# Patient Record
Sex: Female | Born: 1955 | Race: White | Hispanic: No | Marital: Married | Smoking: Never smoker
Health system: Southern US, Community
[De-identification: ages and names within clinical notes are randomized; demographics above are authoritative.]

## PROBLEM LIST (undated history)

## (undated) DIAGNOSIS — K589 Irritable bowel syndrome without diarrhea: Secondary | ICD-10-CM

## (undated) HISTORY — DX: Irritable bowel syndrome without diarrhea: K58.9

## (undated) HISTORY — PX: VAGINAL HYSTERECTOMY: SUR661

## (undated) HISTORY — PX: APPENDECTOMY: SHX54

## (undated) HISTORY — PX: BLADDER SUSPENSION: SHX72

## (undated) HISTORY — PX: ARTHROSCOPIC REPAIR ACL: SUR80

## (undated) HISTORY — PX: FOOT SURGERY: SHX648

## (undated) HISTORY — PX: CHOLECYSTECTOMY: SHX55

---

## 1986-09-02 HISTORY — PX: AUGMENTATION MAMMAPLASTY: SUR837

## 2005-08-02 ENCOUNTER — Encounter: Admission: RE | Admit: 2005-08-02 | Discharge: 2005-08-02 | Payer: Self-pay | Admitting: Family Medicine

## 2005-08-22 ENCOUNTER — Encounter: Admission: RE | Admit: 2005-08-22 | Discharge: 2005-08-22 | Payer: Self-pay | Admitting: Family Medicine

## 2006-09-11 ENCOUNTER — Encounter: Admission: RE | Admit: 2006-09-11 | Discharge: 2006-09-11 | Payer: Self-pay | Admitting: Family Medicine

## 2007-09-15 ENCOUNTER — Encounter: Admission: RE | Admit: 2007-09-15 | Discharge: 2007-09-15 | Payer: Self-pay | Admitting: Family Medicine

## 2008-10-06 ENCOUNTER — Encounter: Admission: RE | Admit: 2008-10-06 | Discharge: 2008-10-06 | Payer: Self-pay | Admitting: Family Medicine

## 2008-12-27 ENCOUNTER — Other Ambulatory Visit: Admission: RE | Admit: 2008-12-27 | Discharge: 2008-12-27 | Payer: Self-pay | Admitting: Obstetrics & Gynecology

## 2009-11-09 ENCOUNTER — Encounter: Admission: RE | Admit: 2009-11-09 | Discharge: 2009-11-09 | Payer: Self-pay | Admitting: Obstetrics and Gynecology

## 2010-01-02 ENCOUNTER — Ambulatory Visit (HOSPITAL_COMMUNITY): Admission: RE | Admit: 2010-01-02 | Discharge: 2010-01-03 | Payer: Self-pay | Admitting: Obstetrics and Gynecology

## 2010-01-02 ENCOUNTER — Encounter: Payer: Self-pay | Admitting: Obstetrics and Gynecology

## 2010-06-05 ENCOUNTER — Encounter: Admission: RE | Admit: 2010-06-05 | Discharge: 2010-06-05 | Payer: Self-pay | Admitting: Family Medicine

## 2010-11-19 ENCOUNTER — Other Ambulatory Visit: Payer: Self-pay | Admitting: Family Medicine

## 2010-11-19 DIAGNOSIS — Z1231 Encounter for screening mammogram for malignant neoplasm of breast: Secondary | ICD-10-CM

## 2010-11-20 LAB — CBC
MCHC: 35 g/dL (ref 30.0–36.0)
MCV: 100.9 fL — ABNORMAL HIGH (ref 78.0–100.0)
MCV: 99.2 fL (ref 78.0–100.0)
Platelets: 177 10*3/uL (ref 150–400)
Platelets: 222 10*3/uL (ref 150–400)
RBC: 4.31 MIL/uL (ref 3.87–5.11)
WBC: 6 10*3/uL (ref 4.0–10.5)
WBC: 9 10*3/uL (ref 4.0–10.5)

## 2010-11-20 LAB — ABO/RH: ABO/RH(D): O POS

## 2010-11-20 LAB — COMPREHENSIVE METABOLIC PANEL
ALT: 25 U/L (ref 0–35)
AST: 25 U/L (ref 0–37)
Albumin: 4.2 g/dL (ref 3.5–5.2)
CO2: 28 mEq/L (ref 19–32)
Chloride: 105 mEq/L (ref 96–112)
Creatinine, Ser: 0.93 mg/dL (ref 0.4–1.2)
GFR calc Af Amer: >60 mL/min (ref >60)
GFR calc non Af Amer: >60 mL/min (ref >60)
Sodium: 136 mEq/L (ref 135–145)
Total Bilirubin: 0.4 mg/dL (ref 0.3–1.2)

## 2010-11-20 LAB — HEMOGLOBIN AND HEMATOCRIT, BLOOD: Hemoglobin: 13.5 g/dL (ref 12.0–15.0)

## 2010-11-20 LAB — PROTIME-INR: Prothrombin Time: 11.6 seconds (ref 11.6–15.2)

## 2010-11-20 LAB — APTT: aPTT: 26 seconds (ref 24–37)

## 2010-11-20 LAB — TYPE AND SCREEN

## 2010-11-27 ENCOUNTER — Ambulatory Visit
Admission: RE | Admit: 2010-11-27 | Discharge: 2010-11-27 | Disposition: A | Discharge status: Home / Self Care | Payer: BC Managed Care – PPO | Source: Ambulatory Visit | Attending: Family Medicine | Admitting: Family Medicine

## 2010-11-27 DIAGNOSIS — Z1231 Encounter for screening mammogram for malignant neoplasm of breast: Secondary | ICD-10-CM

## 2010-11-30 ENCOUNTER — Other Ambulatory Visit: Payer: Self-pay | Admitting: Family Medicine

## 2010-11-30 DIAGNOSIS — R928 Other abnormal and inconclusive findings on diagnostic imaging of breast: Secondary | ICD-10-CM

## 2010-12-04 ENCOUNTER — Ambulatory Visit
Admission: RE | Admit: 2010-12-04 | Discharge: 2010-12-04 | Disposition: A | Discharge status: Home / Self Care | Payer: BC Managed Care – PPO | Source: Ambulatory Visit | Attending: Family Medicine | Admitting: Family Medicine

## 2010-12-04 DIAGNOSIS — R928 Other abnormal and inconclusive findings on diagnostic imaging of breast: Secondary | ICD-10-CM

## 2011-06-28 ENCOUNTER — Ambulatory Visit (HOSPITAL_BASED_OUTPATIENT_CLINIC_OR_DEPARTMENT_OTHER)
Admission: RE | Admit: 2011-06-28 | Discharge: 2011-06-28 | Disposition: A | Discharge status: Home / Self Care | Payer: BC Managed Care – PPO | Source: Ambulatory Visit | Attending: Urology | Admitting: Urology

## 2011-06-28 DIAGNOSIS — N8189 Other female genital prolapse: Secondary | ICD-10-CM | POA: Insufficient documentation

## 2011-06-28 DIAGNOSIS — N393 Stress incontinence (female) (male): Secondary | ICD-10-CM | POA: Insufficient documentation

## 2011-06-28 DIAGNOSIS — K219 Gastro-esophageal reflux disease without esophagitis: Secondary | ICD-10-CM | POA: Insufficient documentation

## 2011-06-28 DIAGNOSIS — N8111 Cystocele, midline: Secondary | ICD-10-CM | POA: Insufficient documentation

## 2011-06-28 DIAGNOSIS — Z01812 Encounter for preprocedural laboratory examination: Secondary | ICD-10-CM | POA: Insufficient documentation

## 2011-06-28 LAB — POCT HEMOGLOBIN-HEMACUE: Hemoglobin: 14.8 g/dL (ref 12.0–15.0)

## 2011-07-02 NOTE — Op Note (Signed)
Kristina, Williamson              ACCOUNT NO.:  [id number]  MEDICAL RECORD NO.:  [id number]  LOCATION:                                 FACILITY:  PHYSICIAN:  Eric Morganti I. Patsi Sears, M.D.DATE OF BIRTH:  12-Aug-1956  DATE OF PROCEDURE:  06/28/2011 DATE OF DISCHARGE:                              OPERATIVE REPORT   PREOPERATIVE DIAGNOSES:  Recurrent pelvic floor prolapse and recurrent stress urinary incontinence.  POSTOPERATIVE DIAGNOSES:  Recurrent pelvic floor prolapse and recurrent stress urinary incontinence.  OPERATION:  [employer] light anterior vault sacrospinous fixation and Lynx pubovaginal sling.  SURGEON:  Riyanna Crutchley I. Patsi Sears, M.D.  ANESTHESIA:  General LMA.  PREPARATION:  After appropriate pre anesthesia, the patient was brought to the operating room, placed on the operating table in a dorsal supine position where general LMA anesthesia was induced.  She was then replaced in dorsal lithotomy position where the pubis was prepped with Betadine solution and draped in usual fashion.  Note, intraoperative consultation by Dr. Jonna Coup MacDiarmid.  REVIEW OF HISTORY:  This 55 year old female is para 1-1-0, status post coincidental hysterectomy, with apical sacrospinous fixation, had 4 corner Raz with SPARC sling.  The patient had difficulty voiding postop, in May 2011, but 4 months later, after she was voiding well post Rapaflo, she noticed a "drop" of her bladder coincidental with playing tennis.  Exam shows recurrence of her cystocele, and urethral hypermobility.  Urodynamic shows early sensation, low leak point pressure of 70, with and without cystocele reduction.  The patient was felt to be very physically fit and active, and she will have mesh repair of her cystocele, with sacrospinous fixation  again, and a Lynx sling over the [facility] sling.  PROCEDURE IN DETAIL:  The cystocele was evaluated, and the pelvic organ prolapse measurement appeared to be  Aa=-1.5 cm; Ba=-2.5 cm; Ap=-0.5 cm; Bp=-4 cm, TVL=-5 cm.  Blue marking pen was used to identify the midline of the bladder neck, and the midline of the urethra.  Using 70 cc of Marcaine 0.5 with epinephrine 1:200,000, hydrodistention was accomplished.  Horizontal incision was made across the vagina and subcutaneous tissue dissected with sharp and blunt dissection.  The cystocele was dissected, and reduced with a Kelly plication using 2-0 Vicryl suture.  Dissection was accomplished into the pelvic floor, where a large amount of scar was identified.  The four-corner Lynder Parents was identified at the ischial spines. I asked Dr. Sherron Monday to come into the operating room for consultation, to determine the location of the ischial spines, and determine location of the 4 corner Raz sutures.  Once identified, I was able to takedown the sutures on the right side, the left side was able to go around the sutures.  A captive device was placed, and the [employer] light mesh sleeves were placed through the sacrospinous ligament without difficulty.  Good placement medially was accomplished.  The mesh was sutured in place in both the proximal and distal portions, with tensioning of the mesh.  The mesh was smooth and flat against the bladder.  There was no folding. The sleeves were cut and removed.  Irrigation was accomplished.  There was some bleeding which was troublesome at the  right corner, and I elected to place some FloSeal in the wound.  Therefore, FloSeal was injected bilaterally, with packing for 3 minutes with resolution of bleeding.  I then closed over the mesh with tissue, and then closed the wound with two separate running 2-0 Vicryl sutures.  No vaginal tissue was excised.  The attention was then directed to the [facility] sling.  The midline of the urethra was again marked, and the bladder neck was noted.  Injection with hydrodistention was accomplished with 20 mL of the injectable solution, an incision  was made.  Subcutaneous tissue was dissected bilaterally.  The area suprapubically was noted two fingerbreadths above the pubic tubercle and lateral to the pubic tubercle, and marked with a marking pen.  Incisions were made.  The [place] needles were then placed bilaterally.  Cystoscopy was accomplished after indigo carmine given.  A blue dye was seen from both ureteral orifices as jets.  No Lynx needles were noted in the bladder.  The bladder was examined with both the 30 degree and the 70 degree lenses.  The urethra appeared normal and the bladder base appeared normal as well.  There was some irritation of bladder from previous catheterization.  The [place] mesh was then placed on the needles and brought retropubically with a large right angle clamp in the midline for tensioning.  The clamp was left behind the midline as the blue tab was cut from the midline, and the plastic removed.  The right-angle clamp was also left for smooth tensioning when the arms were then cut subcutaneously at the level of pubic tubercle.  The wound was closed with 2-0 Vicryl suture.  300 mL of saline was placed in the bladder, the catheter removed, and no leakage was noted.  The bladder was then drained with a Foley catheter, Foley catheter and packing remained for 24 hours.  Packing was placed  with the usual packing forceps and Estrace cream.  The patient was given IV Toradol, awakened, taken to recovery room in good condition.     [name] I. Patsi Sears, M.D.     SIT/MEDQ  D:  06/28/2011  T:  06/28/2011  Job:  [id number]  cc:   Marjory Lies, M.D. Fax: [phone]  [name], MD Fax: [phone]  Martina Sinner, MD Fax: [phone]  Electronically Signed by Jethro Bolus M.D. on 07/02/2011 05:25:38 PM

## 2011-11-27 ENCOUNTER — Other Ambulatory Visit: Payer: Self-pay | Admitting: Family Medicine

## 2011-11-27 DIAGNOSIS — Z1231 Encounter for screening mammogram for malignant neoplasm of breast: Secondary | ICD-10-CM

## 2011-12-10 ENCOUNTER — Ambulatory Visit
Admission: RE | Admit: 2011-12-10 | Discharge: 2011-12-10 | Disposition: A | Discharge status: Home / Self Care | Payer: BC Managed Care – PPO | Source: Ambulatory Visit | Attending: Family Medicine | Admitting: Family Medicine

## 2011-12-10 DIAGNOSIS — Z1231 Encounter for screening mammogram for malignant neoplasm of breast: Secondary | ICD-10-CM

## 2012-01-10 IMAGING — MG MM DIGITAL DIAG LTD R {BCG}
2 series · 2 of 2 positions shown · non-contrast
Comparison: 11/27/2010

CLINICAL DATA: Further evaluation of right asymmetry

DIGITAL DIAGNOSTIC RIGHT MAMMOGRAM

[R MLOID]
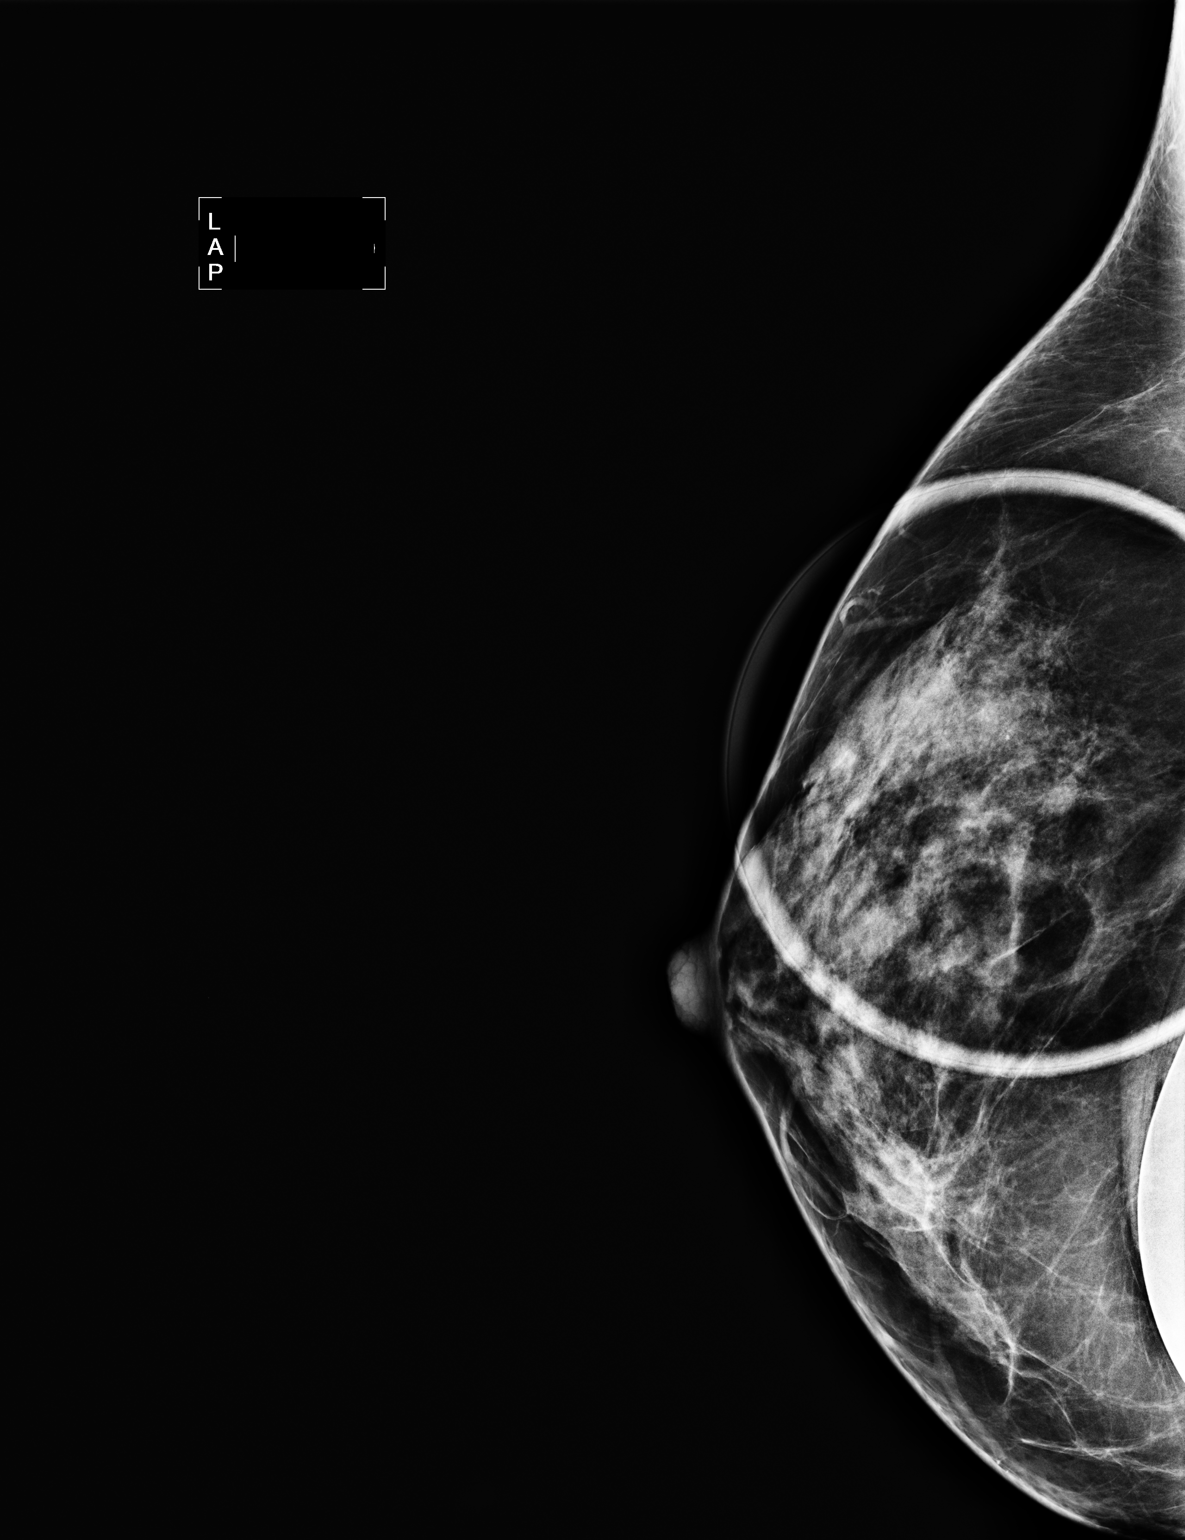

[R MLID]
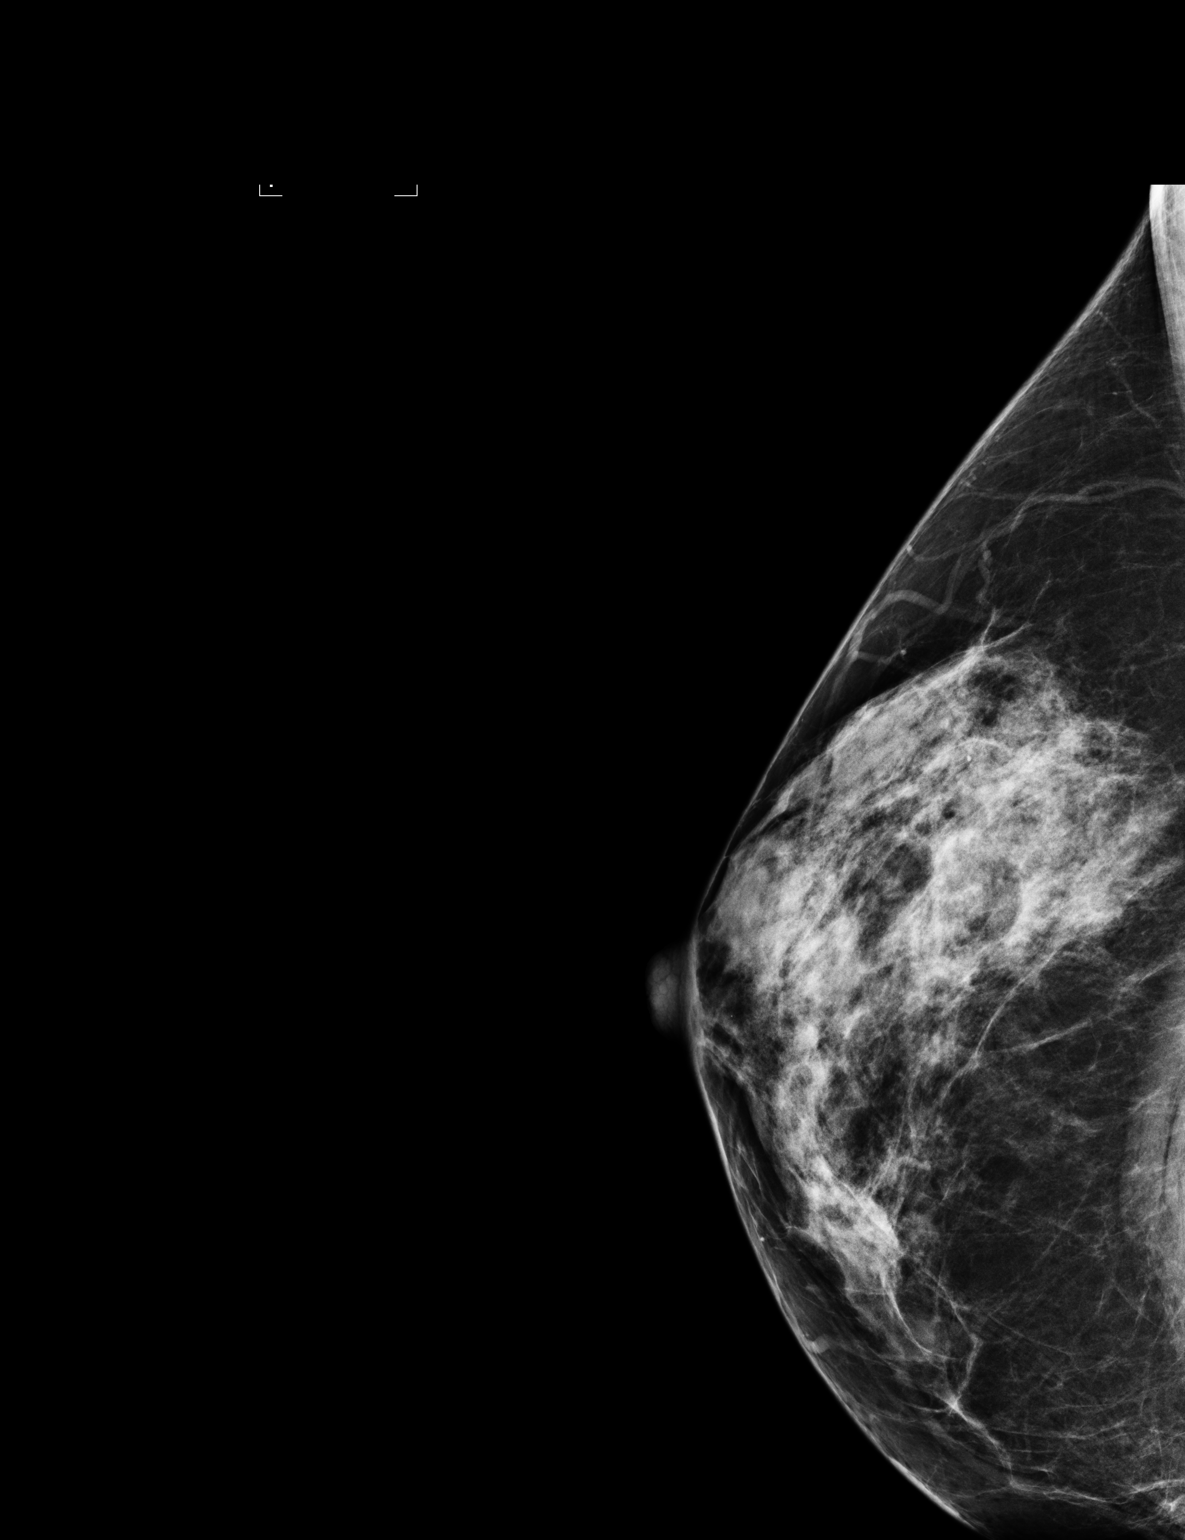

[2 of 2 positions shown; findings below may reference images not displayed]

FINDINGS: No persistent abnormalities on spot compression or true
lateral views.
IMPRESSION: Negative

BI-RADS CATEGORY 1:  Negative.

Recommendation:

Return to annual screening mammography.

## 2012-01-17 ENCOUNTER — Other Ambulatory Visit (HOSPITAL_COMMUNITY): Payer: Self-pay | Admitting: Urology

## 2012-01-17 DIAGNOSIS — R52 Pain, unspecified: Secondary | ICD-10-CM

## 2012-02-10 ENCOUNTER — Other Ambulatory Visit (HOSPITAL_COMMUNITY): Payer: Self-pay | Admitting: Urology

## 2012-02-10 ENCOUNTER — Ambulatory Visit (HOSPITAL_COMMUNITY)
Admission: RE | Admit: 2012-02-10 | Discharge: 2012-02-10 | Disposition: A | Discharge status: Home / Self Care | Payer: BC Managed Care – PPO | Source: Ambulatory Visit | Attending: Urology | Admitting: Urology

## 2012-02-10 DIAGNOSIS — N898 Other specified noninflammatory disorders of vagina: Secondary | ICD-10-CM | POA: Insufficient documentation

## 2012-02-10 DIAGNOSIS — R52 Pain, unspecified: Secondary | ICD-10-CM

## 2012-02-10 DIAGNOSIS — N329 Bladder disorder, unspecified: Secondary | ICD-10-CM | POA: Insufficient documentation

## 2012-02-10 DIAGNOSIS — N8111 Cystocele, midline: Secondary | ICD-10-CM | POA: Insufficient documentation

## 2012-02-10 DIAGNOSIS — N816 Rectocele: Secondary | ICD-10-CM | POA: Insufficient documentation

## 2012-12-14 ENCOUNTER — Other Ambulatory Visit: Payer: Self-pay

## 2012-12-14 DIAGNOSIS — Z1231 Encounter for screening mammogram for malignant neoplasm of breast: Secondary | ICD-10-CM

## 2013-01-13 ENCOUNTER — Ambulatory Visit
Admission: RE | Admit: 2013-01-13 | Discharge: 2013-01-13 | Disposition: A | Discharge status: Home / Self Care | Payer: BC Managed Care – PPO | Source: Ambulatory Visit

## 2013-01-13 DIAGNOSIS — Z1231 Encounter for screening mammogram for malignant neoplasm of breast: Secondary | ICD-10-CM

## 2013-12-28 ENCOUNTER — Other Ambulatory Visit: Payer: Self-pay

## 2013-12-28 DIAGNOSIS — Z1231 Encounter for screening mammogram for malignant neoplasm of breast: Secondary | ICD-10-CM

## 2014-01-18 ENCOUNTER — Encounter (INDEPENDENT_AMBULATORY_CARE_PROVIDER_SITE_OTHER): Payer: Self-pay

## 2014-01-18 ENCOUNTER — Ambulatory Visit
Admission: RE | Admit: 2014-01-18 | Discharge: 2014-01-18 | Disposition: A | Discharge status: Home / Self Care | Payer: BC Managed Care – PPO | Source: Ambulatory Visit

## 2014-01-18 DIAGNOSIS — Z1231 Encounter for screening mammogram for malignant neoplasm of breast: Secondary | ICD-10-CM

## 2014-01-19 ENCOUNTER — Ambulatory Visit (INDEPENDENT_AMBULATORY_CARE_PROVIDER_SITE_OTHER): Payer: BC Managed Care – PPO | Admitting: Cardiology

## 2014-01-19 ENCOUNTER — Encounter: Payer: Self-pay | Admitting: Cardiology

## 2014-01-19 VITALS — BP 112/64 | HR 80 | Ht 66.0 in | Wt 139.7 lb

## 2014-01-19 DIAGNOSIS — R002 Palpitations: Secondary | ICD-10-CM

## 2014-01-19 NOTE — Patient Instructions (Signed)
The current medical regimen is effective;  continue present plan and medications.  Follow up as needed 

## 2014-01-19 NOTE — Progress Notes (Signed)
HPI The patient presents for evaluation of palpitations and also for screening for possible coronary disease. Her husband recently had 2 stents placed she just wanted to be evaluated. She has had no prior cardiac history herself other than an episode of shortness of breath some years ago it was felt ultimately to be anxiety. She denies any chest pressure, neck or arm discomfort. She does not get shortness of breath, PND or orthopnea. She has no presyncope or syncope. She will rarely have some morning palpitations. However, these are few and far between and not reproducible. She is quite active and exercises routinely without bringing on cardiovascular symptoms.  No Known Allergies  Current Outpatient Prescriptions  Medication Sig Dispense Refill  . acyclovir (ZOVIRAX) 800 MG tablet Take 800 mg by mouth as needed.      . ALPRAZolam (XANAX) 0.5 MG tablet Take 0.5 mg by mouth at bedtime as needed for anxiety.      Marland Kitchen. omeprazole (PRILOSEC) 20 MG capsule Take 20 mg by mouth daily.      Marland Kitchen. zolpidem (AMBIEN) 5 MG tablet Take 10 mg by mouth at bedtime as needed for sleep.       No current facility-administered medications for this visit.    Past Medical History  Diagnosis Date  . IBS (irritable bowel syndrome)     Past Surgical History  Procedure Laterality Date  . Bladder suspension      2  . Vaginal hysterectomy    . Arthroscopic repair acl      right  . Foot surgery    . Cholecystectomy    . Appendectomy      No family history on file.  History   Social History  . Marital Status: Married    Spouse Name: N/A    Number of Children: 1  . Years of Education: N/A   Occupational History  . Not on file.   Social History Main Topics  . Smoking status: Never Smoker   . Smokeless tobacco: Not on file     Comment: "Social smoker" in past  . Alcohol Use: 7.0 oz/week    14 drink(s) per week  . Drug Use: Not on file  . Sexual Activity: Not on file   Other Topics Concern  . Not  on file   Social History Narrative   Lives with wife and husband.     ROS:   Positive for reflux.  As stated in the HPI and negative for all other systems.  PHYSICAL EXAM BP 112/64  Pulse 80  Ht 5\' 6"  (1.676 m)  Wt 139 lb 11.2 oz (63.368 kg)  BMI 22.56 kg/m2 GENERAL:  Well appearing HEENT:  Pupils equal round and reactive, fundi not visualized, oral mucosa unremarkable NECK:  No jugular venous distention, waveform within normal limits, carotid upstroke brisk and symmetric, no bruits, no thyromegaly LYMPHATICS:  No cervical, inguinal adenopathy LUNGS:  Clear to auscultation bilaterally BACK:  No CVA tenderness CHEST:  Unremarkable HEART:  PMI not displaced or sustained,S1 and S2 within normal limits, no S3, no S4, no clicks, no rubs, no murmurs ABD:  Flat, positive bowel sounds normal in frequency in pitch, no bruits, no rebound, no guarding, no midline pulsatile mass, no hepatomegaly, no splenomegaly EXT:  2 plus pulses throughout, no edema, no cyanosis no clubbing SKIN:  No rashes no nodules NEURO:  Cranial nerves II through XII grossly intact, motor grossly intact throughout PSYCH:  Cognitively intact, oriented to person place and time  EKG:  Sinus rhythm, rate 80, axis within normal limits, intervals within normal limits, no acute ST-T wave changes.01/19/2014  ASSESSMENT AND PLAN  RISK STRATIFICATION:  Patient has no cardiovascular risk factors. I reviewed her lipids which are excellent.  She is quite active. Given this I think the pretest probability of obstructive coronary disease is very low. I would not suggest further testing at this point and we had a long discussion about this.  PALPITATIONS:  These are rare.   We discussed the contribution that caffeine might be playing.  At this point no change in therapy or further evaluation is indicated.

## 2015-01-02 ENCOUNTER — Other Ambulatory Visit: Payer: Self-pay

## 2015-01-02 DIAGNOSIS — Z9882 Breast implant status: Secondary | ICD-10-CM

## 2015-01-02 DIAGNOSIS — Z1231 Encounter for screening mammogram for malignant neoplasm of breast: Secondary | ICD-10-CM

## 2015-01-26 ENCOUNTER — Ambulatory Visit
Admission: RE | Admit: 2015-01-26 | Discharge: 2015-01-26 | Disposition: A | Discharge status: Home / Self Care | Payer: BLUE CROSS/BLUE SHIELD | Source: Ambulatory Visit

## 2015-01-26 DIAGNOSIS — Z1231 Encounter for screening mammogram for malignant neoplasm of breast: Secondary | ICD-10-CM

## 2015-01-26 DIAGNOSIS — Z9882 Breast implant status: Secondary | ICD-10-CM

## 2016-01-04 ENCOUNTER — Other Ambulatory Visit: Payer: Self-pay

## 2016-01-04 DIAGNOSIS — Z1231 Encounter for screening mammogram for malignant neoplasm of breast: Secondary | ICD-10-CM

## 2016-02-01 ENCOUNTER — Ambulatory Visit
Admission: RE | Admit: 2016-02-01 | Discharge: 2016-02-01 | Disposition: A | Discharge status: Home / Self Care | Payer: BLUE CROSS/BLUE SHIELD | Source: Ambulatory Visit

## 2016-02-01 DIAGNOSIS — Z1231 Encounter for screening mammogram for malignant neoplasm of breast: Secondary | ICD-10-CM

## 2017-01-23 ENCOUNTER — Other Ambulatory Visit: Payer: Self-pay | Admitting: Physician Assistant

## 2017-01-23 DIAGNOSIS — Z1231 Encounter for screening mammogram for malignant neoplasm of breast: Secondary | ICD-10-CM

## 2017-02-12 ENCOUNTER — Ambulatory Visit
Admission: RE | Admit: 2017-02-12 | Discharge: 2017-02-12 | Disposition: A | Discharge status: Home / Self Care | Payer: No Typology Code available for payment source | Source: Ambulatory Visit | Attending: Physician Assistant | Admitting: Physician Assistant

## 2017-02-12 DIAGNOSIS — Z1231 Encounter for screening mammogram for malignant neoplasm of breast: Secondary | ICD-10-CM

## 2018-05-11 ENCOUNTER — Other Ambulatory Visit: Payer: Self-pay | Admitting: Physician Assistant

## 2018-05-11 DIAGNOSIS — Z1231 Encounter for screening mammogram for malignant neoplasm of breast: Secondary | ICD-10-CM

## 2018-06-09 ENCOUNTER — Ambulatory Visit
Admission: RE | Admit: 2018-06-09 | Discharge: 2018-06-09 | Disposition: A | Discharge status: Home / Self Care | Payer: No Typology Code available for payment source | Source: Ambulatory Visit | Attending: Physician Assistant | Admitting: Physician Assistant

## 2018-06-09 DIAGNOSIS — Z1231 Encounter for screening mammogram for malignant neoplasm of breast: Secondary | ICD-10-CM

## 2019-09-14 ENCOUNTER — Other Ambulatory Visit: Payer: Self-pay | Admitting: Physician Assistant

## 2019-09-14 DIAGNOSIS — Z1231 Encounter for screening mammogram for malignant neoplasm of breast: Secondary | ICD-10-CM

## 2019-10-21 ENCOUNTER — Ambulatory Visit: Payer: No Typology Code available for payment source

## 2019-11-24 ENCOUNTER — Other Ambulatory Visit: Payer: Self-pay

## 2019-11-24 ENCOUNTER — Ambulatory Visit
Admission: RE | Admit: 2019-11-24 | Discharge: 2019-11-24 | Disposition: A | Discharge status: Home / Self Care | Payer: No Typology Code available for payment source | Source: Ambulatory Visit | Attending: Physician Assistant | Admitting: Physician Assistant

## 2019-11-24 DIAGNOSIS — Z1231 Encounter for screening mammogram for malignant neoplasm of breast: Secondary | ICD-10-CM

## 2021-04-10 ENCOUNTER — Other Ambulatory Visit: Payer: Self-pay | Admitting: Physician Assistant

## 2021-04-10 DIAGNOSIS — Z1231 Encounter for screening mammogram for malignant neoplasm of breast: Secondary | ICD-10-CM

## 2021-04-13 ENCOUNTER — Ambulatory Visit
Admission: RE | Admit: 2021-04-13 | Discharge: 2021-04-13 | Disposition: A | Discharge status: Home / Self Care | Payer: PRIVATE HEALTH INSURANCE | Source: Ambulatory Visit | Attending: Physician Assistant | Admitting: Physician Assistant

## 2021-04-13 ENCOUNTER — Other Ambulatory Visit: Payer: Self-pay | Admitting: Physician Assistant

## 2021-04-13 ENCOUNTER — Other Ambulatory Visit: Payer: Self-pay

## 2021-04-13 DIAGNOSIS — Z1231 Encounter for screening mammogram for malignant neoplasm of breast: Secondary | ICD-10-CM

## 2022-05-20 IMAGING — MG DIGITAL SCREENING BREAST BILAT IMPLANT W/ TOMO W/ CAD
8 of 14 series · 8 of 34 positions shown · non-contrast
Comparison: Previous exam(s).

CLINICAL DATA: Screening.

EXAM:
DIGITAL SCREENING BILATERAL MAMMOGRAM WITH IMPLANTS, CAD AND
TOMOSYNTHESIS
TECHNIQUE: Bilateral screening digital craniocaudal and mediolateral oblique
mammograms were obtained. Bilateral screening digital breast
tomosynthesis was performed. The images were evaluated with
computer-aided detection. Standard and/or implant displaced views
were performed.

[R MLO]
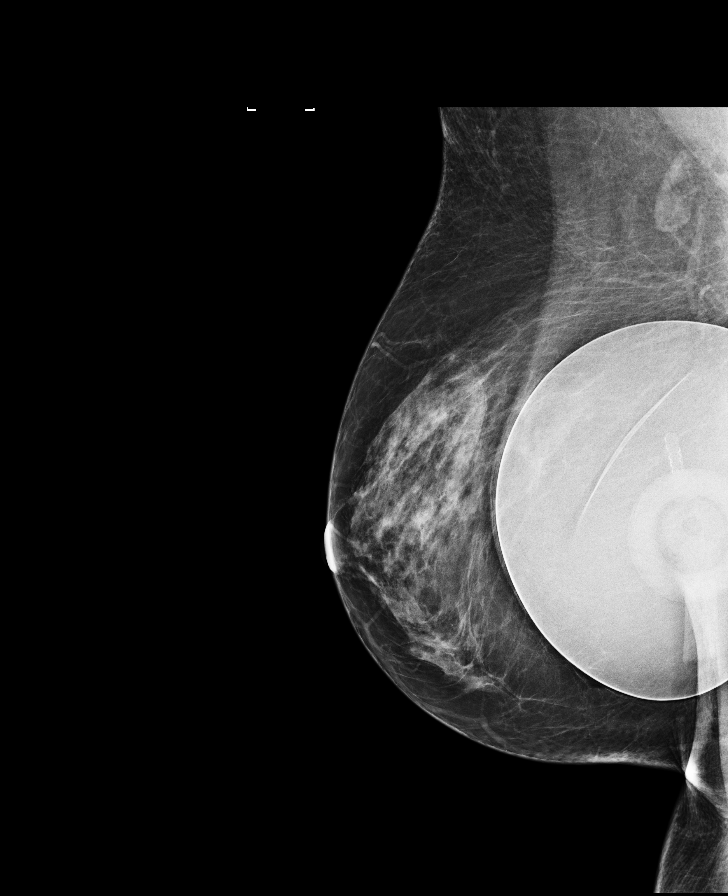

[R CC]
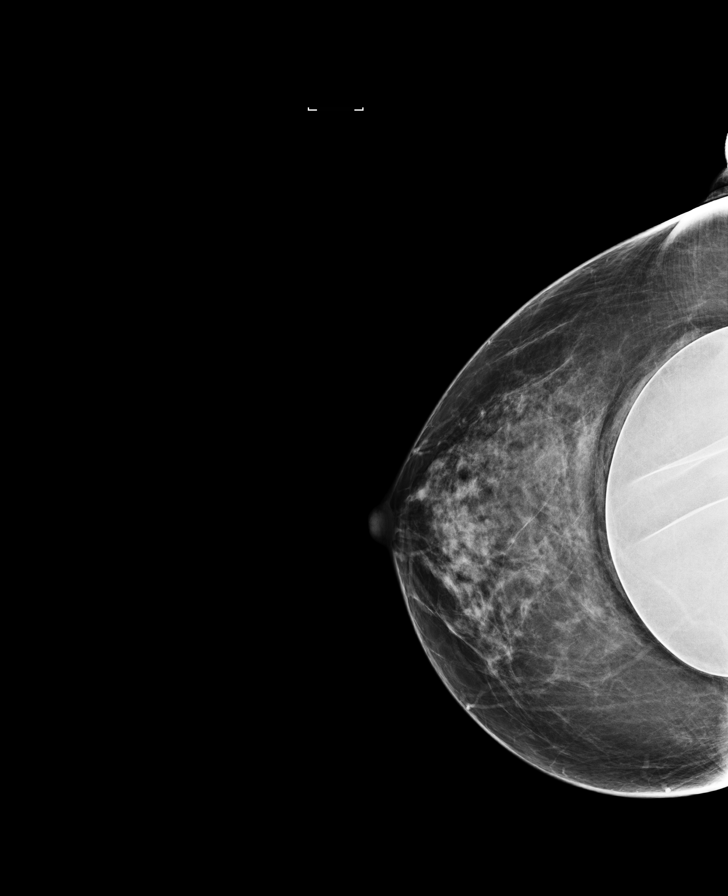

[L CC]
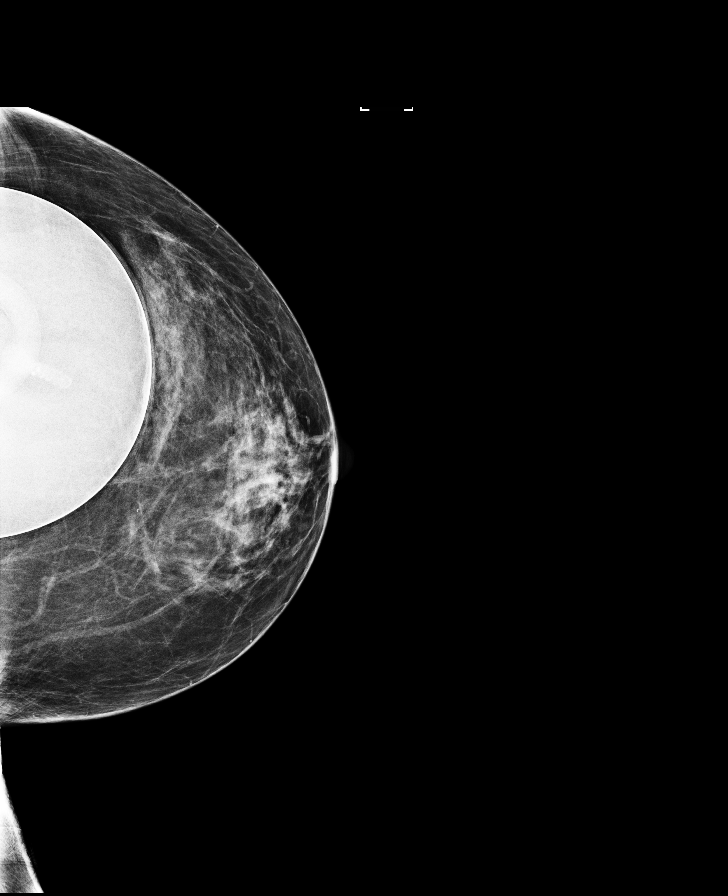

[L MLO]
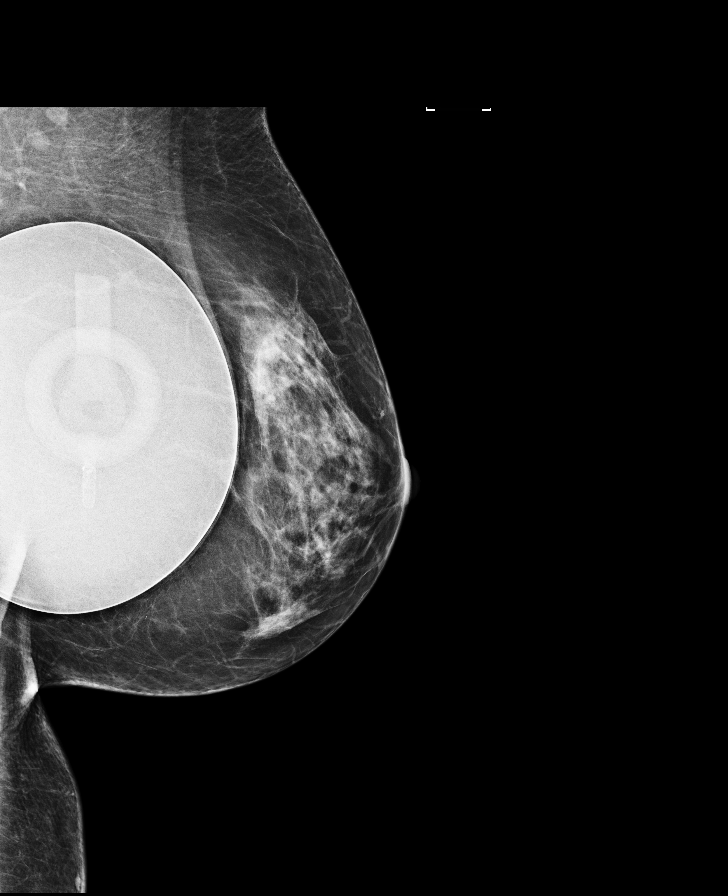

[R MLO synth-2D]
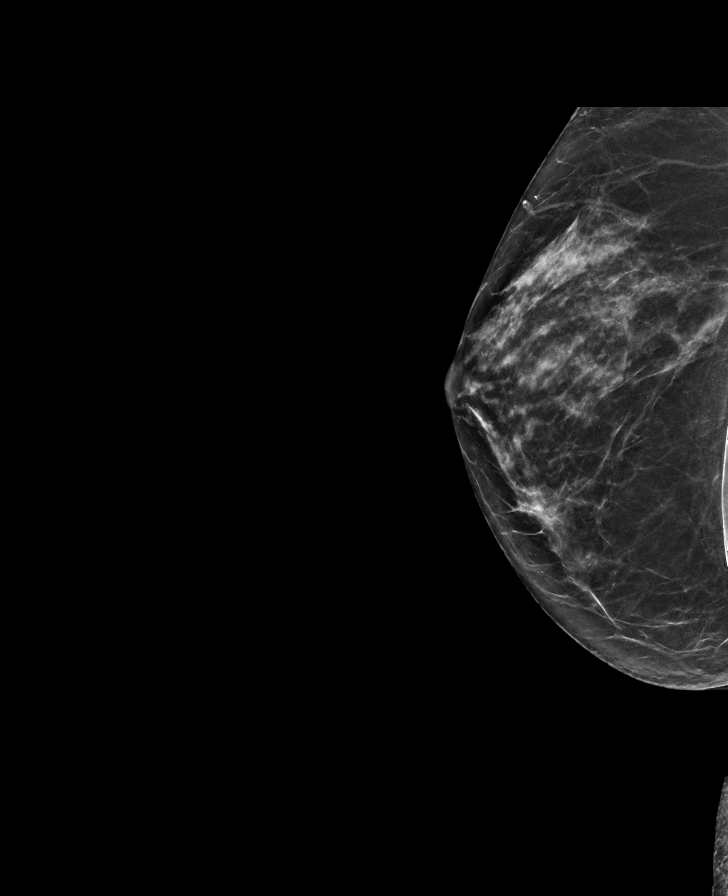

[R CC synth-2D]
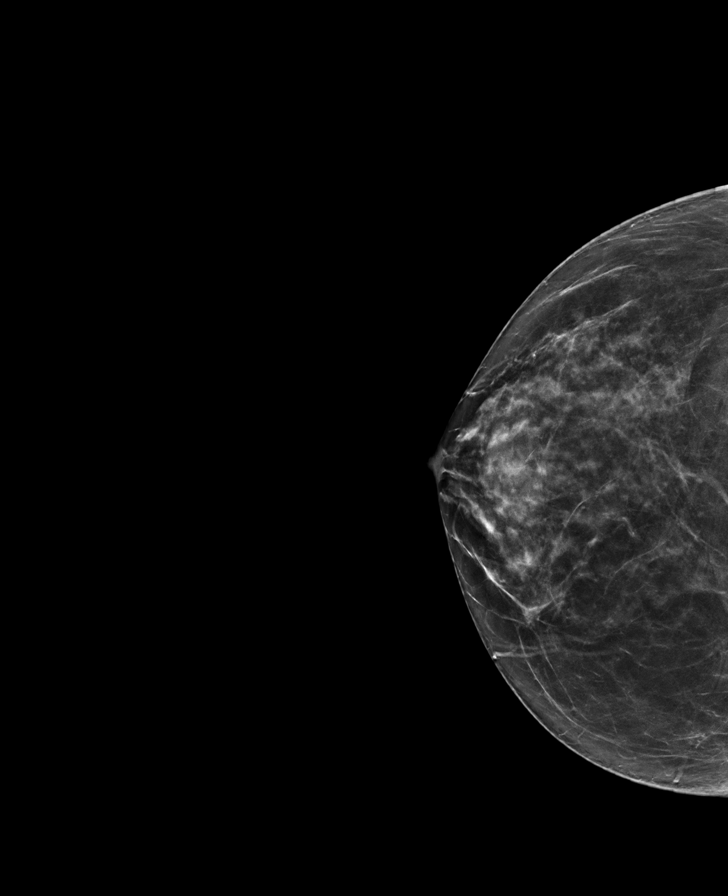

[L MLO synth-2D]
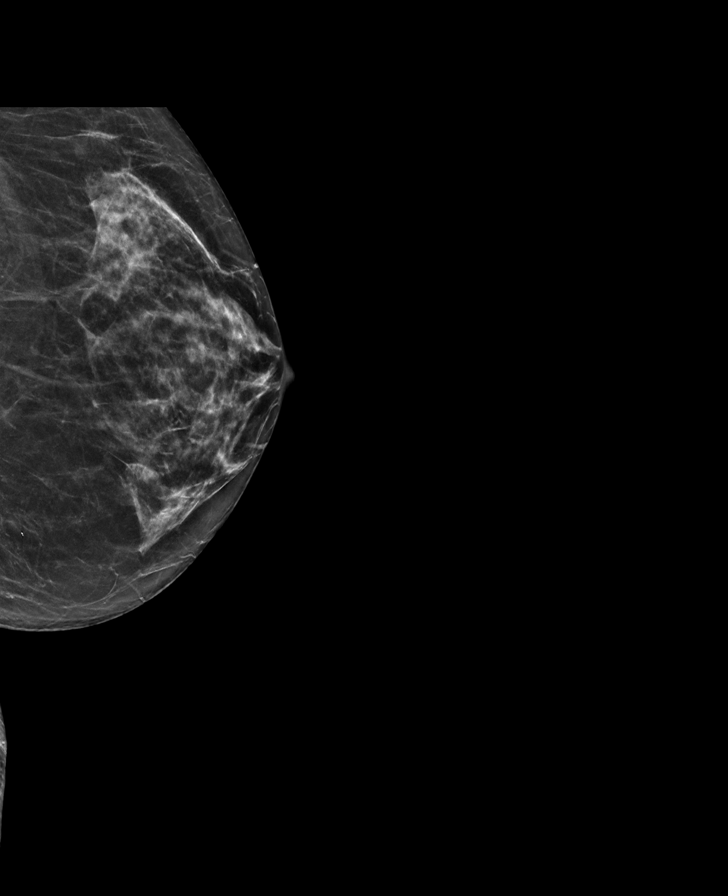

[L CC synth-2D]
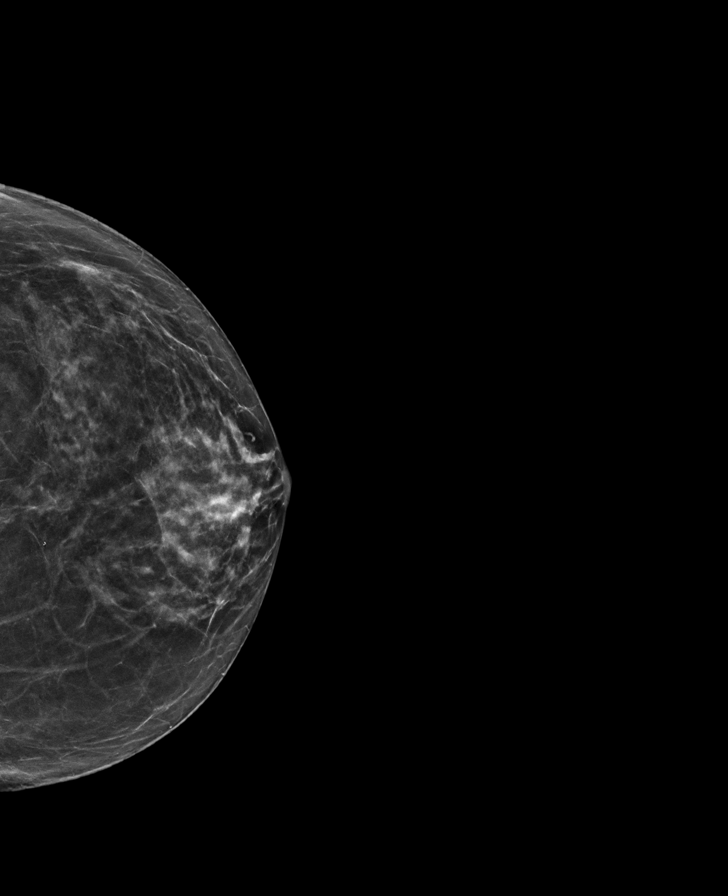

[8 of 34 positions shown; findings below may reference images not displayed]

ACR Breast Density Category c: The breast tissue is heterogeneously
dense, which may obscure small masses.
FINDINGS: The patient has retropectoral implants. There are no findings
suspicious for malignancy.
IMPRESSION: No mammographic evidence of malignancy. A result letter of this
screening mammogram will be mailed directly to the patient.

RECOMMENDATION:
Screening mammogram in one year. (Code:LT-E-7TH)

BI-RADS CATEGORY  1:  Negative.

## 2022-06-05 ENCOUNTER — Other Ambulatory Visit: Payer: Self-pay | Admitting: Physician Assistant

## 2022-06-05 DIAGNOSIS — Z1231 Encounter for screening mammogram for malignant neoplasm of breast: Secondary | ICD-10-CM

## 2022-07-10 ENCOUNTER — Ambulatory Visit
Admission: RE | Admit: 2022-07-10 | Discharge: 2022-07-10 | Disposition: A | Discharge status: Home / Self Care | Payer: Medicare Other | Source: Ambulatory Visit | Attending: Physician Assistant | Admitting: Physician Assistant

## 2022-07-10 DIAGNOSIS — Z1231 Encounter for screening mammogram for malignant neoplasm of breast: Secondary | ICD-10-CM

## 2023-07-29 ENCOUNTER — Other Ambulatory Visit: Payer: Self-pay | Admitting: Family Medicine

## 2023-07-29 DIAGNOSIS — Z780 Asymptomatic menopausal state: Secondary | ICD-10-CM

## 2023-08-13 ENCOUNTER — Other Ambulatory Visit: Payer: Self-pay | Admitting: Family Medicine

## 2023-08-13 DIAGNOSIS — Z1231 Encounter for screening mammogram for malignant neoplasm of breast: Secondary | ICD-10-CM

## 2023-09-11 ENCOUNTER — Ambulatory Visit
Admission: RE | Admit: 2023-09-11 | Discharge: 2023-09-11 | Disposition: A | Discharge status: Home / Self Care | Payer: Medicare Other | Source: Ambulatory Visit | Attending: Family Medicine | Admitting: Family Medicine

## 2023-09-11 DIAGNOSIS — Z1231 Encounter for screening mammogram for malignant neoplasm of breast: Secondary | ICD-10-CM

## 2024-04-06 ENCOUNTER — Other Ambulatory Visit: Payer: BLUE CROSS/BLUE SHIELD

## 2024-05-26 ENCOUNTER — Other Ambulatory Visit (HOSPITAL_BASED_OUTPATIENT_CLINIC_OR_DEPARTMENT_OTHER)
